# Patient Record
Sex: Male | Born: 1960 | Marital: Single | Smoking: Never smoker
Health system: Southern US, Community
[De-identification: ages and names within clinical notes are randomized; demographics above are authoritative.]

## PROBLEM LIST (undated history)

## (undated) DIAGNOSIS — I3139 Other pericardial effusion (noninflammatory): Secondary | ICD-10-CM

## (undated) DIAGNOSIS — I10 Essential (primary) hypertension: Secondary | ICD-10-CM

## (undated) DIAGNOSIS — I313 Pericardial effusion (noninflammatory): Secondary | ICD-10-CM

## (undated) DIAGNOSIS — E119 Type 2 diabetes mellitus without complications: Secondary | ICD-10-CM

## (undated) DIAGNOSIS — J45909 Unspecified asthma, uncomplicated: Secondary | ICD-10-CM

## (undated) HISTORY — DX: Essential (primary) hypertension: I10

## (undated) HISTORY — DX: Pericardial effusion (noninflammatory): I31.3

## (undated) HISTORY — DX: Unspecified asthma, uncomplicated: J45.909

## (undated) HISTORY — DX: Other pericardial effusion (noninflammatory): I31.39

## (undated) HISTORY — DX: Type 2 diabetes mellitus without complications: E11.9

---

## 2012-11-06 ENCOUNTER — Inpatient Hospital Stay: Payer: Self-pay | Admitting: Internal Medicine

## 2012-11-06 DIAGNOSIS — I319 Disease of pericardium, unspecified: Secondary | ICD-10-CM

## 2012-11-06 LAB — BASIC METABOLIC PANEL
Anion Gap: 6 — ABNORMAL LOW (ref 7–16)
Calcium, Total: 8.9 mg/dL (ref 8.5–10.1)
Chloride: 98 mmol/L (ref 98–107)
Co2: 27 mmol/L (ref 21–32)

## 2012-11-06 LAB — CBC WITH DIFFERENTIAL/PLATELET
Basophil %: 1.1 %
HCT: 34.4 % — ABNORMAL LOW (ref 40.0–52.0)
HGB: 11.5 g/dL — ABNORMAL LOW (ref 13.0–18.0)
Lymphocyte #: 1.3 10*3/uL (ref 1.0–3.6)
Lymphocyte %: 15.4 %
MCHC: 33.5 g/dL (ref 32.0–36.0)
Monocyte #: 1 x10 3/mm (ref 0.2–1.0)
Neutrophil %: 71.4 %
RDW: 12.9 % (ref 11.5–14.5)
WBC: 8.7 10*3/uL (ref 3.8–10.6)

## 2012-11-06 LAB — TROPONIN I: Troponin-I: <0.02 ng/mL

## 2012-11-06 LAB — PRO B NATRIURETIC PEPTIDE: B-Type Natriuretic Peptide: 98 pg/mL (ref 0–125)

## 2012-11-06 LAB — CK TOTAL AND CKMB (NOT AT ARMC)
CK, Total: 49 U/L (ref 35–232)
CK-MB: <0.5 ng/mL — ABNORMAL LOW (ref 0.5–3.6)
CK-MB: <0.5 ng/mL — ABNORMAL LOW (ref 0.5–3.6)

## 2012-11-07 LAB — LIPID PANEL
Cholesterol: 70 mg/dL (ref 0–200)
HDL Cholesterol: 17 mg/dL — ABNORMAL LOW (ref 40–60)
Triglycerides: 44 mg/dL (ref 0–200)

## 2012-11-07 LAB — CK TOTAL AND CKMB (NOT AT ARMC): CK, Total: 46 U/L (ref 35–232)

## 2012-11-14 ENCOUNTER — Encounter: Payer: Self-pay | Admitting: Cardiovascular Disease

## 2014-03-13 IMAGING — CR DG CHEST 1V PORT
1 series · 1 of 1 positions shown · non-contrast
Comparison: none

REASON FOR EXAM: dyspnea
COMMENTS:

PROCEDURE:     DXR - DXR PORTABLE CHEST SINGLE VIEW  - November 06, 2012  [DATE]
RESULT:     The cardiac silhouette is enlarged. There is hypoinflation.
There is no edema, effusion, mass or pneumothorax.

[ap]
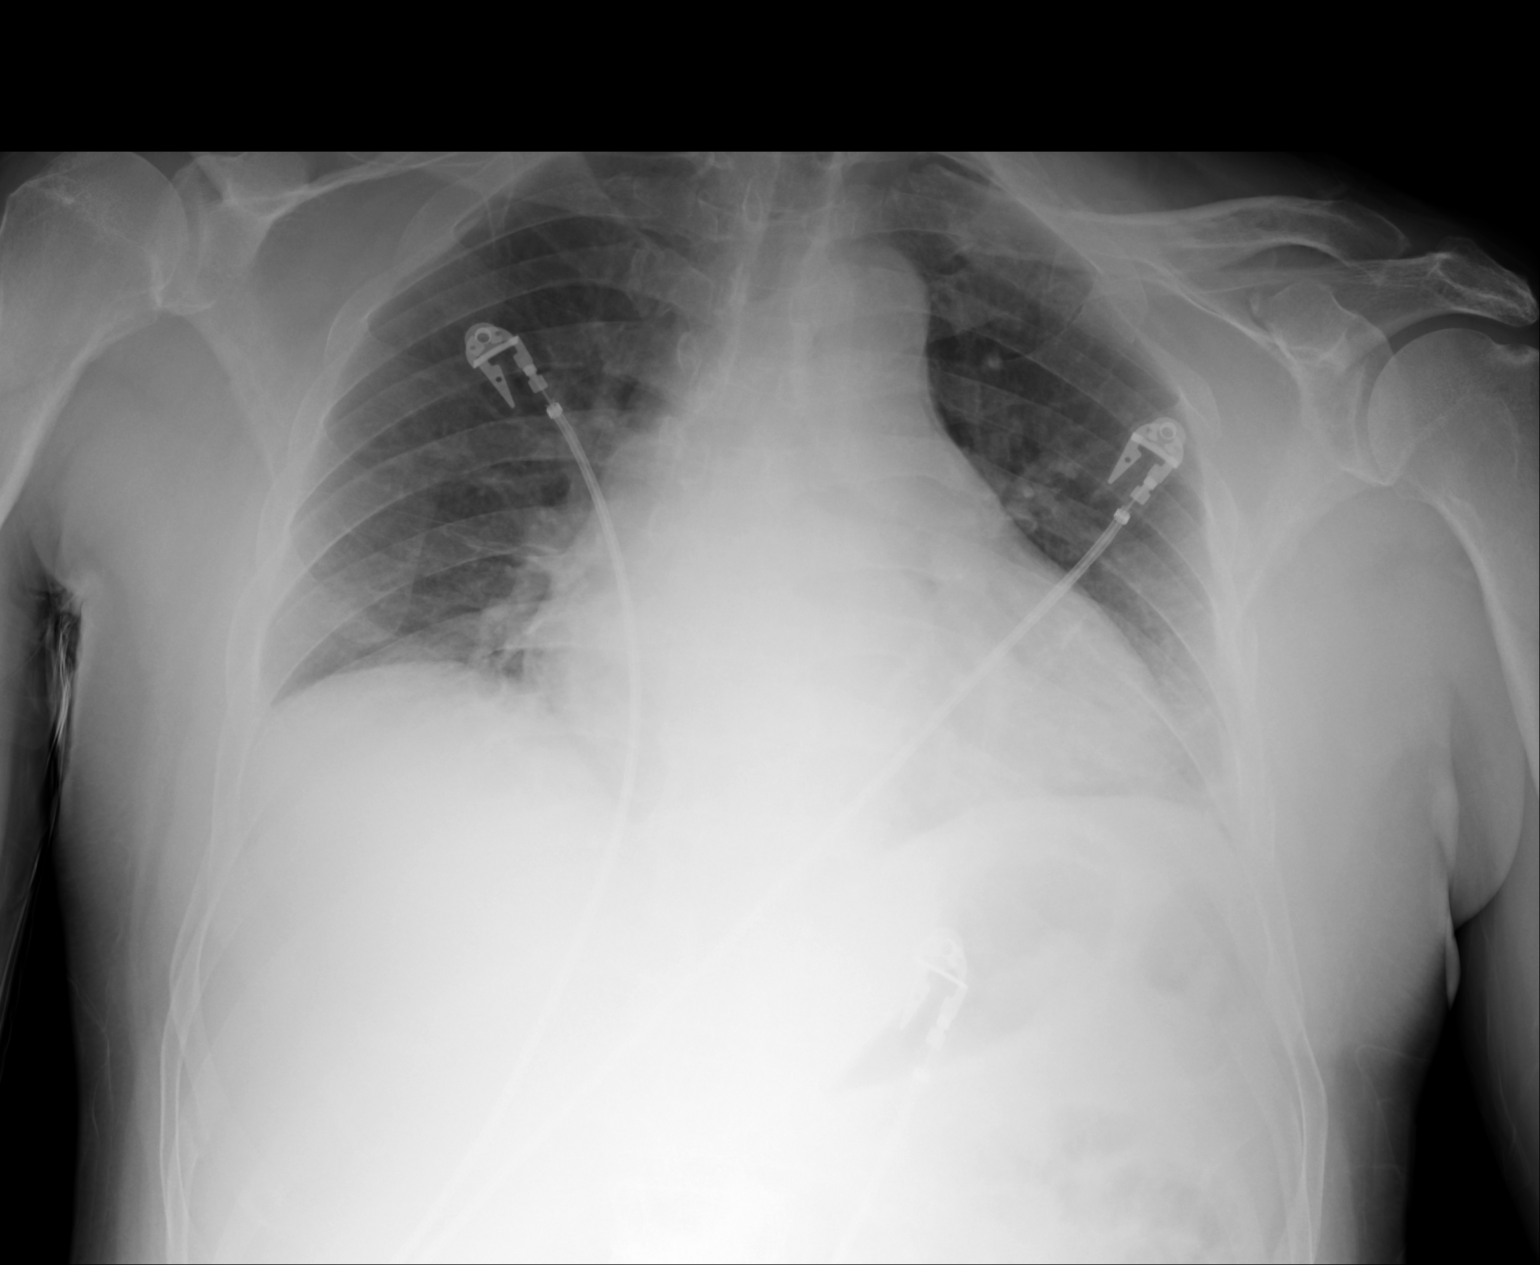

[1 of 1 positions shown; findings below may reference images not displayed]

IMPRESSION: 1. Cardiomegaly.

[REDACTED]

## 2014-03-13 IMAGING — CT CT CHEST W/ CM
2 series · 15 of 31 positions shown, 19 images · IV contrast (APPLIED)
Comparison: none

REASON FOR EXAM: DYSPNEA, CHEST PAIN WITH BREATHING
COMMENTS:

PROCEDURE:     CT  - CT CHEST (FOR PE) W  - November 06, 2012 [DATE]
RESULT:     Chest CT dated 11/06/2012.
TECHNIQUE: Helical 3 mm sections were obtained the thoracic inlet the lung
bases status post intravenous administration of 100 mm of 5sovue-07L.

[Series 4: soft tissue · axial · 0.74mm/px · z∈[-732,-690]mm · 2 of 91 slices shown]
[im 7/91  mediastinal]
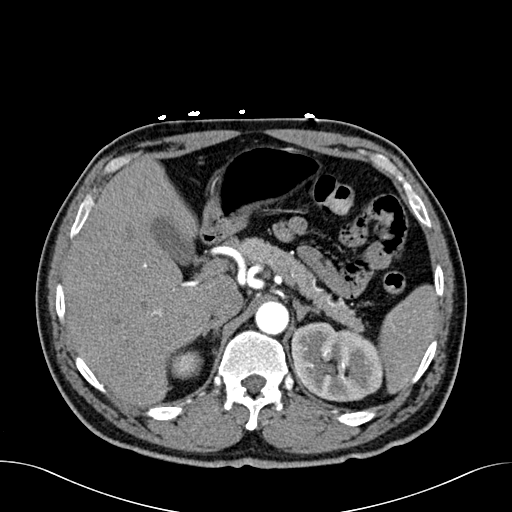
[im 21/91  mediastinal]
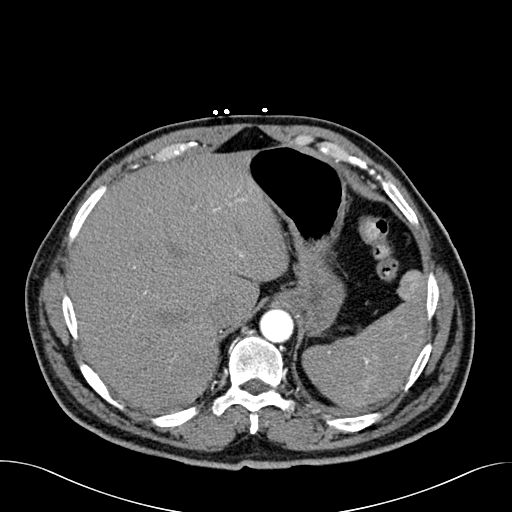

[Series 5: lung windows · axial · 0.74mm/px · z∈[-726,-502]mm · 13 of 89 slices shown, 17 images]
[im 7/89  mediastinal]
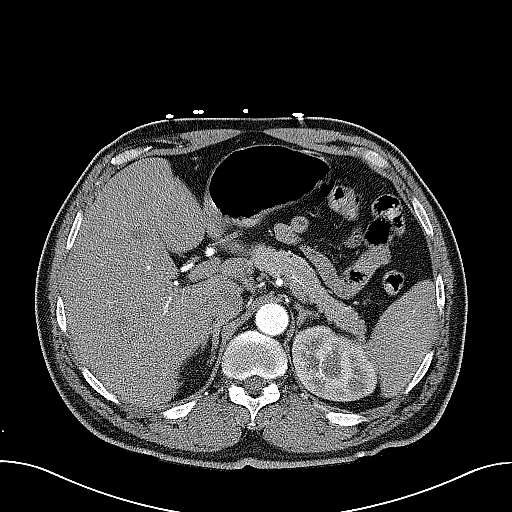
[im 7/89  lung]
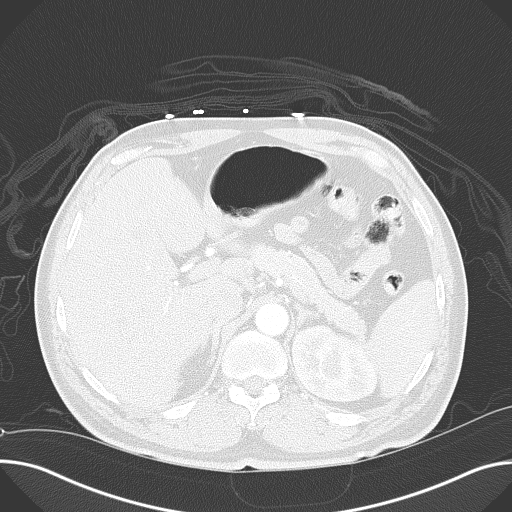
[im 14/89  lung]
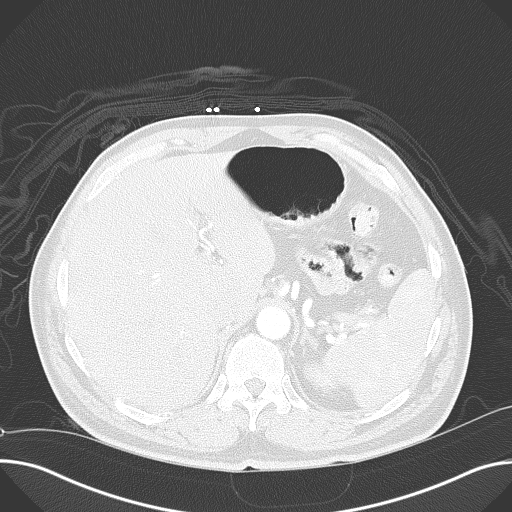
[im 21/89  lung]
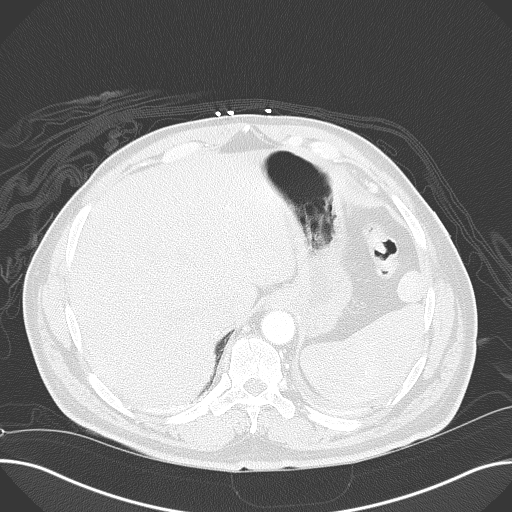
[im 28/89  lung]
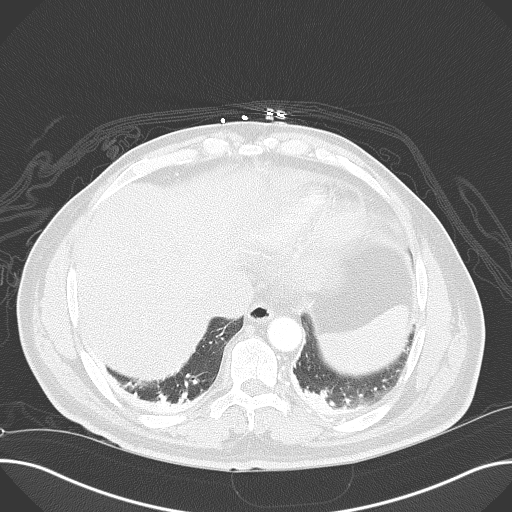
[im 34/89  mediastinal]
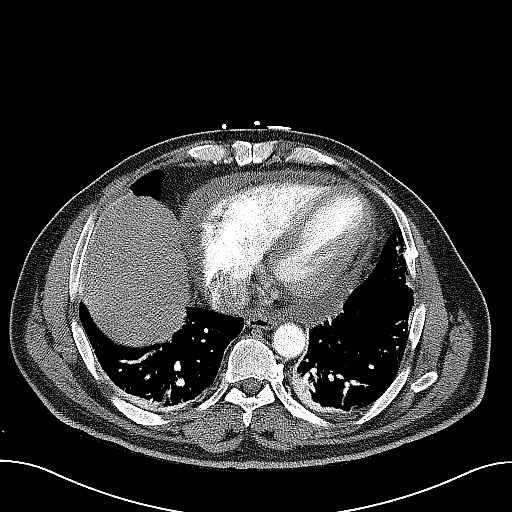
[im 34/89  lung]
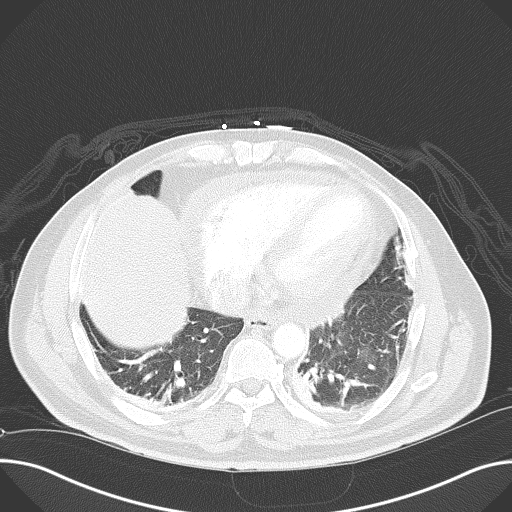
[im 41/89  lung]
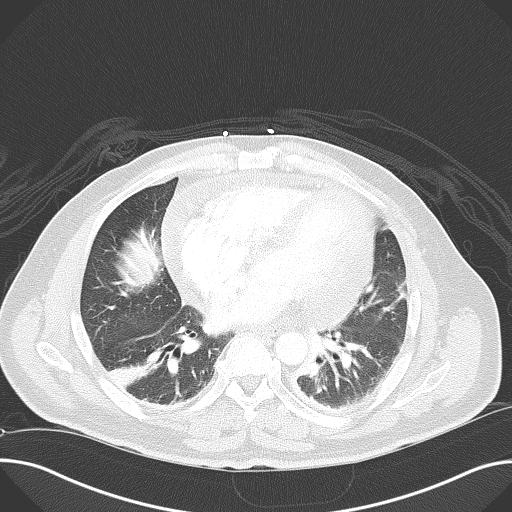
[im 45/89  lung]
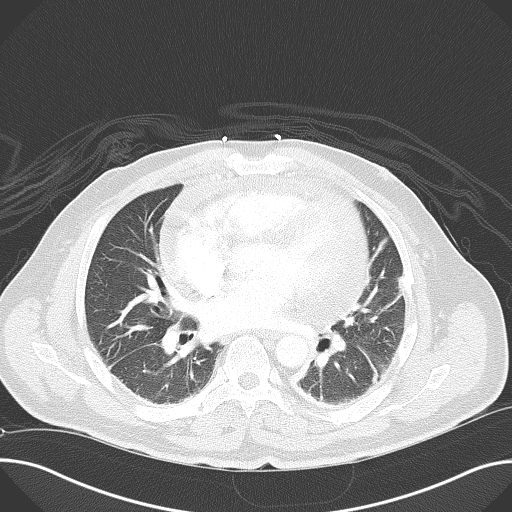
[im 48/89  lung]
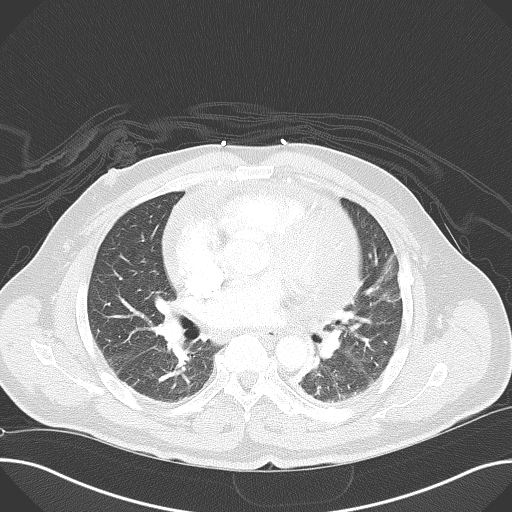
[im 55/89  mediastinal]
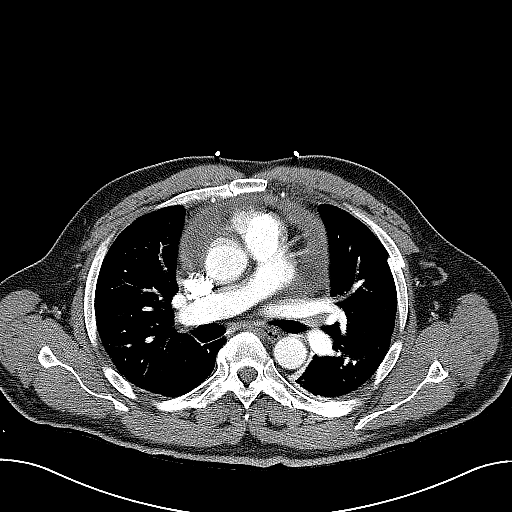
[im 55/89  lung]
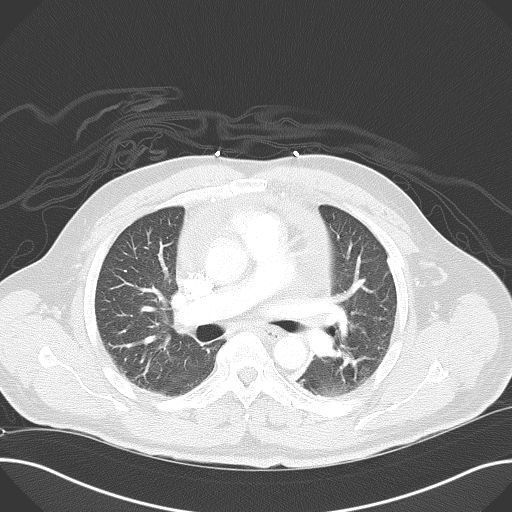
[im 61/89  lung]
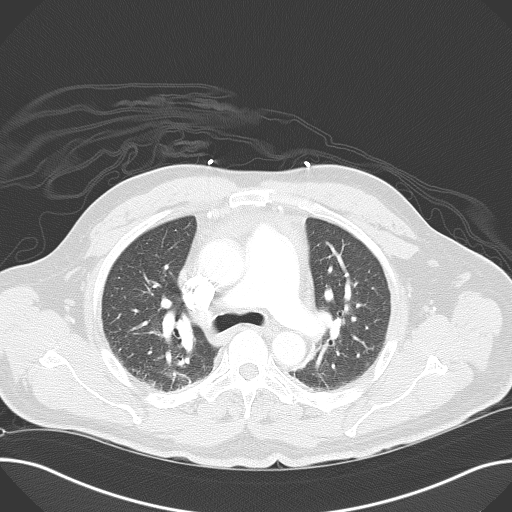
[im 68/89  lung]
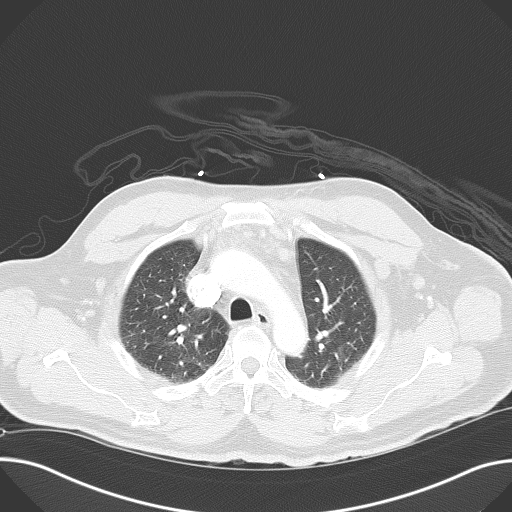
[im 75/89  lung]
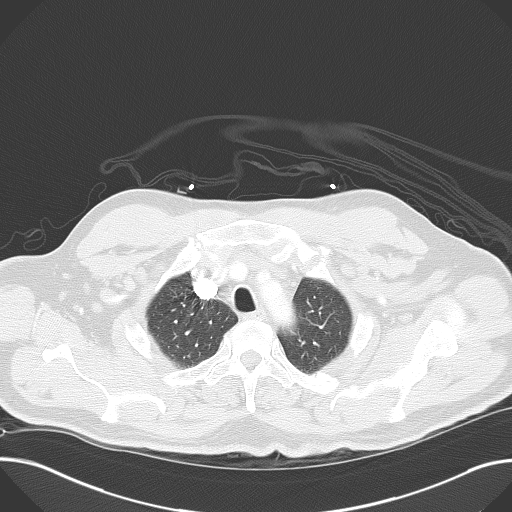
[im 82/89  mediastinal]
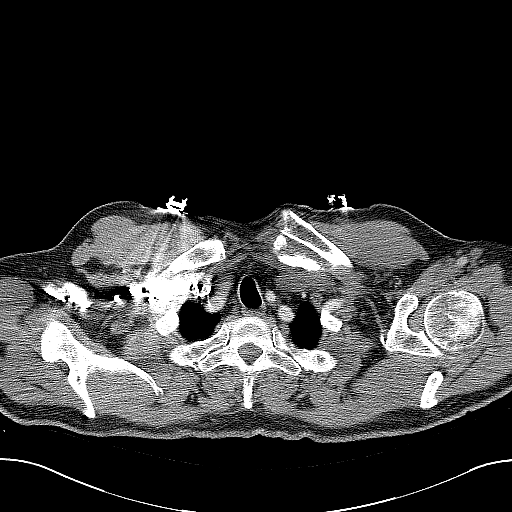
[im 82/89  lung]
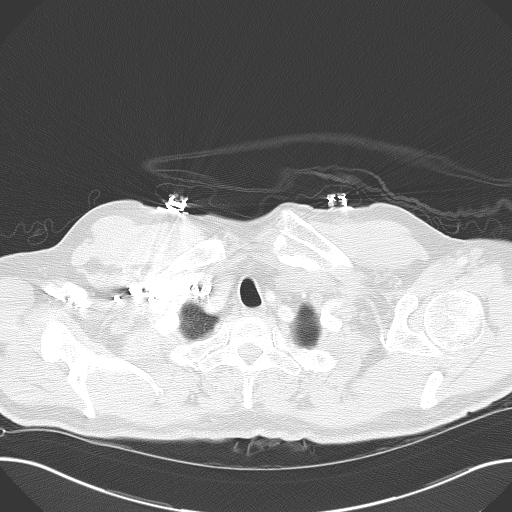

[15 of 31 positions shown; findings below may reference images not displayed]

FINDINGS: Multiple subcentimeter lymph nodes identified within the
prevascular space. A small to moderate sized pericardial effusion is
identified. Moderately prominent subcentimeter lymph nodes identified within
the axillary regions. Prominent supraclavicular lymph nodes also identified.
There is no evidence of a filling defect within the main, lobar, or
segmental pulmonary arteries. The lung parenchyma straight mild atelectasis
versus infiltrates within the lung bases.

Visualized upper abdominal viscera demonstrate no gross abnormalities.
IMPRESSION: 1. Pericardial effusion clinical correlation recommended and further
evaluation with echocardiography and cardiology consultation recommended and
if clinically warranted.
2. No evidence of pulmonary arterial embolic disease.
3. Atelectasis versus infiltrate within the lung bases
4. Prominent lymph nodes as described above.
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
## 2017-11-02 ENCOUNTER — Ambulatory Visit (INDEPENDENT_AMBULATORY_CARE_PROVIDER_SITE_OTHER): Payer: BLUE CROSS/BLUE SHIELD | Admitting: Urology

## 2017-11-02 ENCOUNTER — Encounter: Payer: Self-pay | Admitting: Urology

## 2017-11-02 VITALS — BP 155/84 | HR 80 | Ht 66.0 in | Wt 174.0 lb

## 2017-11-02 DIAGNOSIS — N529 Male erectile dysfunction, unspecified: Secondary | ICD-10-CM

## 2017-11-02 MED ORDER — SILDENAFIL CITRATE 20 MG PO TABS: ORAL_TABLET | ORAL | 0 refills | Status: DC

## 2017-11-02 NOTE — Progress Notes (Signed)
11/02/2017 9:23 AM   Christen Bameonnie Ozella AlmondM [place] September 30, 1960 [id number]  Referring provider: Ventura SellersGodwin, Patrick, MD [phone] [address] [place], [place] [id number]  Chief Complaint  Patient presents with  . Erectile Dysfunction    HPI: Anthony RungRonnie Rashid is a 57 year old male seen in consultation at the request of Dr. Courtney ParisGodwin for evaluation of erectile dysfunction.  He presents with an approximately 9610-month history of difficulty achieving and maintaining an erection.  He currently notes only partial erections which are rarely firm enough for penetration.  He denies pain with erections or penile curvature.  He has good libido and denies tiredness/fatigue.  Organic risk factors include diabetes mellitus, hypertension and antihypertensive medications.  He has had no treatment for this condition.  He has no bothersome lower urinary tract symptoms.  Denies dysuria or gross hematuria.  Denies flank, abdominal, pelvic or scrotal pain.  He denies the use sublingual nitrates or cardiac disease.   PMH: Past Medical History:  Diagnosis Date  . Asthma   . Diabetes mellitus without complication (HCC)   . Hypertension   . Pericardial effusion    moderate    Surgical History: History reviewed. No pertinent surgical history.  Home Medications:  Allergies as of 11/02/2017   No Known Allergies     Medication List        Accurate as of 11/02/17  9:23 AM. Always use your most recent med list.          amLODipine 5 MG tablet Commonly known as:  NORVASC Take 5 mg by mouth daily.   ibuprofen 400 MG tablet Commonly known as:  ADVIL,MOTRIN Take 400 mg by mouth every 6 (six) hours as needed for pain.   losartan 50 MG tablet Commonly known as:  COZAAR Take 50 mg by mouth daily.   metFORMIN 500 MG tablet Commonly known as:  GLUCOPHAGE Take 500 mg by mouth daily with breakfast.   TOUJEO MAX SOLOSTAR 300 UNIT/ML Sopn Generic drug:  Insulin Glargine Inject 50 Units into the skin.       Allergies: No Known  Allergies  Family History: Family History  Problem Relation Age of Onset  . Heart attack Father   . Prostate cancer Neg Hx   . Bladder Cancer Neg Hx   . Kidney cancer Neg Hx     Social History:  reports that he has never smoked. He has never used smokeless tobacco. He reports that he drinks alcohol. He reports that he does not use drugs.  ROS: UROLOGY Frequent Urination?: No Hard to postpone urination?: No Burning/pain with urination?: No Get up at night to urinate?: No Leakage of urine?: No Urine stream starts and stops?: No Trouble starting stream?: No Do you have to strain to urinate?: No Blood in urine?: No Urinary tract infection?: No Sexually transmitted disease?: No Injury to kidneys or bladder?: No Painful intercourse?: No Weak stream?: No Erection problems?: Yes Penile pain?: No  Gastrointestinal Nausea?: No Vomiting?: No Indigestion/heartburn?: No Diarrhea?: No Constipation?: No  Constitutional Fever: No Night sweats?: No Weight loss?: No Fatigue?: No  Skin Skin rash/lesions?: No Itching?: No  Eyes Blurred vision?: No Double vision?: No  Ears/Nose/Throat Sore throat?: No Sinus problems?: No  Hematologic/Lymphatic Swollen glands?: No Easy bruising?: No  Cardiovascular Leg swelling?: No Chest pain?: No  Respiratory Cough?: No Shortness of breath?: No  Endocrine Excessive thirst?: No  Musculoskeletal Back pain?: No Joint pain?: No  Neurological Headaches?: No Dizziness?: No  Psychologic Depression?: No Anxiety?: No  Physical Exam: BP Marland Kitchen(!)  155/84 (BP Location: Right Arm, Patient Position: Sitting, Cuff Size: Normal)   Pulse 80   Ht 5\' 6"  (1.676 m)   Wt 174 lb (78.9 kg)   BMI 28.08 kg/m   Constitutional:  Alert and oriented, No acute distress. HEENT: [place] AT, moist mucus membranes.  Trachea midline, no masses. Cardiovascular: No clubbing, cyanosis, or edema. Respiratory: Normal respiratory effort, no increased work of  breathing. GI: Abdomen is soft, nontender, nondistended, no abdominal masses GU: No CVA tenderness.  Penis uncircumcised without lesions.  No palpable Peyronie's plaques.  Testes descended bilaterally without masses or tenderness and are slightly atrophic.  Prostate 40 g, smooth without nodules. Lymph: No cervical or inguinal lymphadenopathy. Skin: No rashes, bruises or suspicious lesions. Neurologic: Grossly intact, no focal deficits, moving all 4 extremities. Psychiatric: Normal mood and affect.  Laboratory Data: Lab Results  Component Value Date   WBC 8.7 11/06/2012   HGB 11.5 (L) 11/06/2012   HCT 34.4 (L) 11/06/2012   MCV 83 11/06/2012   PLT 546 (H) 11/06/2012    Lab Results  Component Value Date   CREATININE 0.82 11/06/2012    Pertinent Imaging: N/A  Assessment & Plan:   57 year old male with erectile dysfunction and risk factors of diabetes, hypertension and antihypertensive medications.  He has had no previous treatments.  We discussed PDE 5 inhibitor therapy which she would like to try initially.  Intracavernosal injection therapy was also discussed.  Rx generic sildenafil was sent to his pharmacy.  He has slight testicular atrophy and will also check a testosterone and LH.  He will be notified with results and further recommendations.    Riki Altes, MD  [facility] [address] [place], [place] [address] [phone]

## 2017-11-03 LAB — TESTOSTERONE: TESTOSTERONE: 380 ng/dL (ref 264–916)

## 2017-11-03 LAB — LUTEINIZING HORMONE: LH: 9.2 m[IU]/mL — AB (ref 1.7–8.6)

## 2017-11-07 ENCOUNTER — Telehealth: Payer: Self-pay | Admitting: Urology

## 2017-11-07 ENCOUNTER — Encounter: Payer: Self-pay | Admitting: Urology

## 2017-11-07 ENCOUNTER — Telehealth: Payer: Self-pay

## 2017-11-07 MED ORDER — SILDENAFIL CITRATE 20 MG PO TABS: ORAL_TABLET | ORAL | 5 refills | Status: AC

## 2017-11-07 NOTE — Telephone Encounter (Signed)
Pt called office stating he had blood work last week and is asking for results.  Please advise. Thanks.

## 2017-11-07 NOTE — Telephone Encounter (Signed)
Spoke w/lab test added on

## 2017-11-07 NOTE — Telephone Encounter (Signed)
-----   Message from Riki AltesScott C Stoioff, MD sent at 11/04/2017 12:29 PM EDT ----- Testosterone level was low normal at 380.  Please add on a free testosterone level.

## 2017-11-07 NOTE — Telephone Encounter (Signed)
Testosterone level was low normal at 380. Please add on a free testosterone level.  Spoke with lab and had test added on, patient was notified of this

## 2017-11-10 ENCOUNTER — Telehealth: Payer: Self-pay

## 2017-11-10 LAB — TESTOSTERONE, FREE: Testosterone, Free: 6.9 pg/mL — ABNORMAL LOW (ref 7.2–24.0)

## 2017-11-10 LAB — SPECIMEN STATUS REPORT

## 2017-11-10 NOTE — Telephone Encounter (Signed)
Spoke to patient and notified him of his lab results.  He expressed understanding.  A lab appointment was made for 12/27/2017 at 8:15am to recheck his testosterone.

## 2017-11-10 NOTE — Telephone Encounter (Signed)
LMOM- testosterone low but not low enough for replacement therapy. Call back to make f/u appt.

## 2017-11-10 NOTE — Telephone Encounter (Signed)
-----   Message from Riki AltesScott C Stoioff, MD sent at 11/10/2017  7:13 AM EDT ----- Testosterone levels were low normal however currently not at a level that replacement would be recommended.  Recommend scheduling a follow-up appointment in 4-6 weeks.

## 2017-12-23 ENCOUNTER — Other Ambulatory Visit: Payer: Self-pay | Admitting: Family Medicine

## 2017-12-23 DIAGNOSIS — N529 Male erectile dysfunction, unspecified: Secondary | ICD-10-CM

## 2017-12-27 ENCOUNTER — Other Ambulatory Visit: Payer: BLUE CROSS/BLUE SHIELD

## 2017-12-28 ENCOUNTER — Encounter: Payer: Self-pay | Admitting: Urology

## 2022-03-03 ENCOUNTER — Other Ambulatory Visit: Payer: Self-pay

## 2022-03-03 ENCOUNTER — Emergency Department
Admission: EM | Admit: 2022-03-03 | Discharge: 2022-03-04 | Disposition: A | Discharge status: Home / Self Care | Payer: BLUE CROSS/BLUE SHIELD | Attending: Emergency Medicine | Admitting: Emergency Medicine

## 2022-03-03 DIAGNOSIS — R066 Hiccough: Secondary | ICD-10-CM | POA: Insufficient documentation

## 2022-03-03 DIAGNOSIS — I10 Essential (primary) hypertension: Secondary | ICD-10-CM | POA: Diagnosis not present

## 2022-03-03 DIAGNOSIS — N179 Acute kidney failure, unspecified: Secondary | ICD-10-CM | POA: Insufficient documentation

## 2022-03-03 DIAGNOSIS — E860 Dehydration: Secondary | ICD-10-CM | POA: Insufficient documentation

## 2022-03-03 DIAGNOSIS — E119 Type 2 diabetes mellitus without complications: Secondary | ICD-10-CM | POA: Insufficient documentation

## 2022-03-03 DIAGNOSIS — R112 Nausea with vomiting, unspecified: Secondary | ICD-10-CM | POA: Insufficient documentation

## 2022-03-03 DIAGNOSIS — J45909 Unspecified asthma, uncomplicated: Secondary | ICD-10-CM | POA: Diagnosis not present

## 2022-03-03 LAB — BASIC METABOLIC PANEL
Anion gap: 7 (ref 5–15)
BUN: 55 mg/dL — ABNORMAL HIGH (ref 8–23)
CO2: 25 mmol/L (ref 22–32)
Calcium: 8.7 mg/dL — ABNORMAL LOW (ref 8.9–10.3)
Chloride: 97 mmol/L — ABNORMAL LOW (ref 98–111)
Creatinine, Ser: 2.83 mg/dL — ABNORMAL HIGH (ref 0.61–1.24)
GFR, Estimated: 24 mL/min — ABNORMAL LOW (ref >60)
Glucose, Bld: 211 mg/dL — ABNORMAL HIGH (ref 70–99)
Potassium: 3.6 mmol/L (ref 3.5–5.1)
Sodium: 129 mmol/L — ABNORMAL LOW (ref 135–145)

## 2022-03-03 LAB — CBC
HCT: 35 % — ABNORMAL LOW (ref 39.0–52.0)
Hemoglobin: 11.9 g/dL — ABNORMAL LOW (ref 13.0–17.0)
MCH: 28.8 pg (ref 26.0–34.0)
MCHC: 34 g/dL (ref 30.0–36.0)
MCV: 84.7 fL (ref 80.0–100.0)
Platelets: 258 10*3/uL (ref 150–400)
RBC: 4.13 MIL/uL — ABNORMAL LOW (ref 4.22–5.81)
RDW: 11.7 % (ref 11.5–15.5)
WBC: 6.6 10*3/uL (ref 4.0–10.5)
nRBC: 0 % (ref 0.0–0.2)

## 2022-03-03 LAB — URINALYSIS, ROUTINE W REFLEX MICROSCOPIC
Bacteria, UA: NONE SEEN
Bilirubin Urine: NEGATIVE
Glucose, UA: NEGATIVE mg/dL
Ketones, ur: NEGATIVE mg/dL
Leukocytes,Ua: NEGATIVE
Nitrite: NEGATIVE
Protein, ur: NEGATIVE mg/dL
Specific Gravity, Urine: 1.012 (ref 1.005–1.030)
pH: 5 (ref 5.0–8.0)

## 2022-03-03 LAB — TROPONIN I (HIGH SENSITIVITY): Troponin I (High Sensitivity): 6 ng/L (ref <18)

## 2022-03-03 MED ORDER — LACTATED RINGERS IV BOLUS
1000.0000 mL | Freq: Once | INTRAVENOUS | Status: AC
  Administered 2022-03-03: 1000 mL via INTRAVENOUS

## 2022-03-03 MED ORDER — SODIUM CHLORIDE 0.9 % IV BOLUS
1000.0000 mL | Freq: Once | INTRAVENOUS | Status: AC
  Administered 2022-03-03: 1000 mL via INTRAVENOUS

## 2022-03-03 MED ORDER — METOCLOPRAMIDE HCL 5 MG/ML IJ SOLN
10.0000 mg | INTRAMUSCULAR | Status: AC
  Administered 2022-03-03: 10 mg via INTRAVENOUS
  Filled 2022-03-03: qty 2

## 2022-03-03 NOTE — ED Notes (Signed)
Pt to ED for emesis and hiccups that started Saturday . Pt also had bloodwork drawn and was told he had low sodium. Pt denies abdominal pain, denies CP, SOB  Pt is A&ox4

## 2022-03-03 NOTE — ED Triage Notes (Signed)
Pt presents to ER from home.  Pt states he was seen yesterday at PCP d/t vomiting and having hiccups.  Pt states he had blood work drawn and was told this morning to come be evaluated for hypoNa+ and to get some fluids.  Pt denies any pain at this time.  Pt A&O x4 at this time in NAD in triage.

## 2022-03-04 LAB — BASIC METABOLIC PANEL
Anion gap: 5 (ref 5–15)
BUN: 49 mg/dL — ABNORMAL HIGH (ref 8–23)
CO2: 26 mmol/L (ref 22–32)
Calcium: 8.2 mg/dL — ABNORMAL LOW (ref 8.9–10.3)
Chloride: 101 mmol/L (ref 98–111)
Creatinine, Ser: 2.36 mg/dL — ABNORMAL HIGH (ref 0.61–1.24)
GFR, Estimated: 30 mL/min — ABNORMAL LOW (ref >60)
Glucose, Bld: 139 mg/dL — ABNORMAL HIGH (ref 70–99)
Potassium: 3.4 mmol/L — ABNORMAL LOW (ref 3.5–5.1)
Sodium: 132 mmol/L — ABNORMAL LOW (ref 135–145)

## 2022-03-04 LAB — TROPONIN I (HIGH SENSITIVITY): Troponin I (High Sensitivity): 6 ng/L (ref <18)

## 2022-03-04 MED ORDER — METOCLOPRAMIDE HCL 10 MG PO TABS: 10.0000 mg | ORAL_TABLET | Freq: Three times a day (TID) | ORAL | 0 refills | Status: AC

## 2022-03-04 NOTE — Discharge Instructions (Addendum)
As we discussed, your labs suggest that you are dehydrated from your persistent vomiting.  We provided IV fluids and offered to admit you to the hospital, but you do not want to stay.  It is important that you drink plenty of Gatorade even if you cannot eat much food so that you continue to hydrate and "wake up" your kidneys.  Please follow-up with your outpatient provider at the next available opportunity to check and and possibly repeat some labs.  Please let her know that your creatinine tonight after 2 L of IV fluids was 2.3 (GFR 30).  We recommend you take the prescribed medication.  It should be helpful to treat your hiccups and prevent them from coming back, and it is also an antinausea medicine.    Return to the emergency department if you develop new or worsening symptoms that concern you.

## 2022-03-04 NOTE — ED Provider Notes (Signed)
[facility] Provider Note    Event Date/Time   First MD Initiated Contact with Patient 03/03/22 2255     (approximate)   History   Abnormal Lab   HPI  Anthony Raymond is a 61 y.o. male who reports a history of hypertension, diabetes, and asthma.  He is also reportedly had a pericardial effusion in the past.  He presents for about a week of persistent vomiting and occasional diarrhea.  He also has nausea and has not been able to eat or drink anything for at least several days if not the full week.  He also has had persistent hiccups for the last 3 to 4 days.  He went to his primary care doctor who did some lab work and then called him and told him he should go to the emergency department because his sodium was low and he needed some fluids.  He said he feels okay other than the persistent nausea and vomiting.  He has had no pain in the chest or the abdomen.  No dysuria.  Some decreased urinary output but he is still urinating.  He had an episode of diarrhea again after coming to the emergency department but he said that is the first time since the symptoms started.  He assumes he ate something bad or has a GI bug.  No recent fever.     Physical Exam   Triage Vital Signs: ED Triage Vitals  Enc Vitals Group     BP 03/03/22 1906 109/85     Pulse Rate 03/03/22 1906 84     Resp 03/03/22 1906 18     Temp 03/03/22 1906 98.5 F (36.9 C)     Temp Source 03/03/22 1906 Oral     SpO2 03/03/22 1906 99 %     Weight 03/03/22 1908 74.4 kg (164 lb)     Height 03/03/22 1908 1.676 m (5\' 6" )     Head Circumference --      Peak Flow --      Pain Score 03/03/22 1907 0     Pain Loc --      Pain Edu? --      Excl. in [place]? --     Most recent vital signs: Vitals:   03/04/22 0136 03/04/22 0328  BP: 119/83 121/76  Pulse: 91 95  Resp: 20 16  Temp: 98.4 F (36.9 C) 98.4 F (36.9 C)  SpO2: 97% 93%     General: Awake, no distress.  Generally well-appearing in spite of  the symptoms. CV:  Good peripheral perfusion.  Regular rate and rhythm, normal heart sounds. Resp:  Normal effort.  Lungs are clear to auscultation bilaterally. Abd:  No distention.  No tenderness to palpation.  No rebound, no guarding. Other:  Normal mentation, normal mood and affect, patient appears quite well and is watching TV in no apparent discomfort in spite of his symptoms.  He does have frequent hiccups.   ED Results / Procedures / Treatments   Labs (all labs ordered are listed, but only abnormal results are displayed) Labs Reviewed  CBC - Abnormal; Notable for the following components:      Result Value   RBC 4.13 (*)    Hemoglobin 11.9 (*)    HCT 35.0 (*)    All other components within normal limits  BASIC METABOLIC PANEL - Abnormal; Notable for the following components:   Sodium 129 (*)    Chloride 97 (*)    Glucose, Bld 211 (*)  BUN 55 (*)    Creatinine, Ser 2.83 (*)    Calcium 8.7 (*)    GFR, Estimated 24 (*)    All other components within normal limits  URINALYSIS, ROUTINE W REFLEX MICROSCOPIC - Abnormal; Notable for the following components:   Color, Urine YELLOW (*)    APPearance CLEAR (*)    Hgb urine dipstick MODERATE (*)    All other components within normal limits  BASIC METABOLIC PANEL - Abnormal; Notable for the following components:   Sodium 132 (*)    Potassium 3.4 (*)    Glucose, Bld 139 (*)    BUN 49 (*)    Creatinine, Ser 2.36 (*)    Calcium 8.2 (*)    GFR, Estimated 30 (*)    All other components within normal limits  TROPONIN I (HIGH SENSITIVITY)  TROPONIN I (HIGH SENSITIVITY)     EKG  ED ECG REPORT I, Loleta Rose, the attending physician, personally viewed and interpreted this ECG.  Date: 03/03/2022 EKG Time: 19: 12 Rate: 82 Rhythm: normal sinus rhythm QRS Axis: normal Intervals: normal ST/T Wave abnormalities: normal Narrative Interpretation: no evidence of acute ischemia    PROCEDURES:  Critical Care performed:  No  Procedures   MEDICATIONS ORDERED IN ED: Medications  sodium chloride 0.9 % bolus 1,000 mL (0 mLs Intravenous Stopped 03/04/22 0117)  lactated ringers bolus 1,000 mL (0 mLs Intravenous Stopped 03/04/22 0117)  metoCLOPramide (REGLAN) injection 10 mg (10 mg Intravenous Given 03/03/22 2342)     IMPRESSION / MDM / ASSESSMENT AND PLAN / ED COURSE  I reviewed the triage vital signs and the nursing notes.                              Differential diagnosis includes, but is not limited to, foodborne illness, viral gastroenteritis, AKI, electrolyte or metabolic abnormality.  Patient's presentation is most consistent with acute presentation with potential threat to life or bodily function.  Labs are stable and within normal limits.  Patient is actually quite well-appearing.  Labs/studies ordered include EKG, urinalysis, basic metabolic panel, CBC, high-sensitivity troponin x2.  CBC is essentially normal but basic metabolic panel is concerning for acute renal failure/acute kidney injury with a creatinine of 2.83.  Looking back through his labs he is typically within normal limits.  His BUN is 55.  His sodium is a little bit low at 129 consistent with acute volume loss hyponatremia.  Urinalysis shows some hematuria but no evidence of infection and he is not having any dysuria.  I offered the patient admission for his acute renal failure and persistent nausea and vomiting but he does not want to stay in the hospital.  We talked about it including the risks and benefits, and eventually he agreed that I would order 1 L normal saline IV bolus and 1 L LR IV bolus and we will recheck a basic metabolic panel after 2 L of fluids.  If he is showing some improvement and is able to tolerate oral intake, he should be able to go home as per his preference, but I still told him I think he should be hospitalized to make sure he is getting better.  I ordered Reglan 10 mg IV which should help with the nausea and  vomiting as well as treating the hiccups.  If that does not work and if he wants me to do so, I can try Thorazine for the hiccups.  I will  reassess after the fluids and additional lab work.  The patient has no tenderness to palpation of the abdomen and there is no indication for imaging.   Clinical Course as of 03/04/22 0534  Thu Mar 04, 2022  0308 I reassessed the patient.  I also reassessed his BMP.  His renal function has improved, now with a creatinine of 2.3, but it is still not where I would like it.  However he has stopped the hiccups and he says he is no longer nauseated or vomiting.  I again strongly encouraged him to let me admit him to the hospital, but he refuses to do so and wants to go home.  His mood and affect are normal, he is very reasonable about it, and he has the capacity to make his decisions, but he is clear with his intention to go home.  I will prescribe Reglan for him for any persistent nausea or vomiting and it should also help if he has recurrent hiccups.  I reiterated through verbal and written instructions to drink plenty of fluids to stay hydrated and to return immediately if he develops new or worsening symptoms.  He says he understands. [CF]    Clinical Course User Index [CF] Loleta Rose, MD     FINAL CLINICAL IMPRESSION(S) / ED DIAGNOSES   Final diagnoses:  Nausea and vomiting, unspecified vomiting type  Hiccups  Dehydration  Acute kidney injury (HCC)     Rx / DC Orders   ED Discharge Orders          Ordered    metoCLOPramide (REGLAN) 10 MG tablet  3 times daily with meals        03/04/22 0313             Note:  This document was prepared using Dragon voice recognition software and may include unintentional dictation errors.   Loleta Rose, MD 03/04/22 [phone]
